# Patient Record
Sex: Male | Born: 1995 | Race: White | Hispanic: No | Marital: Single | Smoking: Former smoker
Health system: Southern US, Community
[De-identification: ages and names within clinical notes are randomized; demographics above are authoritative.]

---
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
## 2016-06-01 ENCOUNTER — Ambulatory Visit: Payer: Self-pay | Admitting: Family Medicine

## 2017-06-09 ENCOUNTER — Encounter: Payer: Self-pay | Admitting: Family Medicine

## 2017-06-09 ENCOUNTER — Ambulatory Visit (INDEPENDENT_AMBULATORY_CARE_PROVIDER_SITE_OTHER): Payer: Self-pay | Admitting: Family Medicine

## 2017-06-09 VITALS — BP 110/80 | HR 109 | Temp 99.1°F | Resp 17 | Wt 153.4 lb

## 2017-06-09 DIAGNOSIS — R05 Cough: Secondary | ICD-10-CM

## 2017-06-09 DIAGNOSIS — R509 Fever, unspecified: Secondary | ICD-10-CM

## 2017-06-09 DIAGNOSIS — R059 Cough, unspecified: Secondary | ICD-10-CM

## 2017-06-09 LAB — POC INFLUENZA A&B (BINAX/QUICKVUE)
Influenza A, POC: NEGATIVE
Influenza B, POC: NEGATIVE

## 2017-06-09 NOTE — Patient Instructions (Signed)
Discussed use of Mucinex D for congestion and Delsym for cough. We will call you with the x-ray result.

## 2017-06-09 NOTE — Progress Notes (Signed)
Subjective:     Patient ID: Ernest MourningKenneth W Harting III, male   DOB: 06-16-1995, 22 y.o.   MRN: [id number] Chief Complaint  Patient presents with  . Cough    Patient comes in office today accompanied by his mother with concerns of cough and body chills for less than 24hrs. Patient reports last night he was very hot and had body chills when symptoms began, patient only reports cough associated with chills. Patient denies sore throat, sinus pain/pressure, muscle ache, ear pain, runny nose or congestion. Patient has tried otc Tylenol Cold & Flu.   HPI Reports dry cough for a week and not feeling ill until the last 24 hours. No tobacco use or flu shot this season. Accompanied by his mother.  Review of Systems     Objective:   Physical Exam  Constitutional: He appears well-developed and well-nourished. No distress.  Ears: T.M's intact without inflammation Throat: no tonsillar enlargement or exudate Neck: no cervical adenopathy Lungs: clear     Assessment:    1. Cough with fever: ? Viral syndrome ? pneumonia  - POC Influenza A&B(BINAX/QUICKVUE) - DG Chest 2 View; Future    Plan:    Discussed otc medication and further f/u if not improving. No insurance so may not get the x-ray.

## 2017-06-11 ENCOUNTER — Emergency Department: Payer: Self-pay

## 2017-06-11 ENCOUNTER — Emergency Department
Admission: EM | Admit: 2017-06-11 | Discharge: 2017-06-11 | Disposition: A | Discharge status: Home / Self Care | Payer: Self-pay | Attending: Emergency Medicine | Admitting: Emergency Medicine

## 2017-06-11 ENCOUNTER — Other Ambulatory Visit: Payer: Self-pay

## 2017-06-11 DIAGNOSIS — N200 Calculus of kidney: Secondary | ICD-10-CM | POA: Insufficient documentation

## 2017-06-11 DIAGNOSIS — J189 Pneumonia, unspecified organism: Secondary | ICD-10-CM

## 2017-06-11 DIAGNOSIS — J101 Influenza due to other identified influenza virus with other respiratory manifestations: Secondary | ICD-10-CM

## 2017-06-11 DIAGNOSIS — J181 Lobar pneumonia, unspecified organism: Secondary | ICD-10-CM

## 2017-06-11 DIAGNOSIS — J1100 Influenza due to unidentified influenza virus with unspecified type of pneumonia: Secondary | ICD-10-CM | POA: Insufficient documentation

## 2017-06-11 DIAGNOSIS — R112 Nausea with vomiting, unspecified: Secondary | ICD-10-CM | POA: Insufficient documentation

## 2017-06-11 LAB — COMPREHENSIVE METABOLIC PANEL
ALT: 21 U/L (ref 17–63)
AST: 28 U/L (ref 15–41)
Albumin: 4.4 g/dL (ref 3.5–5.0)
Alkaline Phosphatase: 44 U/L (ref 38–126)
Anion gap: 14 (ref 5–15)
BUN: 7 mg/dL (ref 6–20)
CO2: 18 mmol/L — ABNORMAL LOW (ref 22–32)
Calcium: 9.2 mg/dL (ref 8.9–10.3)
Chloride: 96 mmol/L — ABNORMAL LOW (ref 101–111)
Creatinine, Ser: 0.76 mg/dL (ref 0.61–1.24)
GFR calc Af Amer: >60 mL/min (ref >60)
GFR calc non Af Amer: >60 mL/min (ref >60)
Glucose, Bld: 129 mg/dL — ABNORMAL HIGH (ref 65–99)
Potassium: 3.2 mmol/L — ABNORMAL LOW (ref 3.5–5.1)
Sodium: 128 mmol/L — ABNORMAL LOW (ref 135–145)
Total Bilirubin: 1.1 mg/dL (ref 0.3–1.2)
Total Protein: 8.2 g/dL — ABNORMAL HIGH (ref 6.5–8.1)

## 2017-06-11 LAB — URINALYSIS, COMPLETE (UACMP) WITH MICROSCOPIC
Bacteria, UA: NONE SEEN
Glucose, UA: NEGATIVE mg/dL
Ketones, ur: 5 mg/dL — AB
Leukocytes, UA: NEGATIVE
Nitrite: NEGATIVE
Protein, ur: 100 mg/dL — AB
Specific Gravity, Urine: 1.035 — ABNORMAL HIGH (ref 1.005–1.030)
pH: 6 (ref 5.0–8.0)

## 2017-06-11 LAB — CBC
HCT: 43.3 % (ref 40.0–52.0)
Hemoglobin: 15.3 g/dL (ref 13.0–18.0)
MCH: 31.6 pg (ref 26.0–34.0)
MCHC: 35.2 g/dL (ref 32.0–36.0)
MCV: 89.6 fL (ref 80.0–100.0)
Platelets: 200 10*3/uL (ref 150–440)
RBC: 4.84 MIL/uL (ref 4.40–5.90)
RDW: 12.9 % (ref 11.5–14.5)
WBC: 13.1 10*3/uL — ABNORMAL HIGH (ref 3.8–10.6)

## 2017-06-11 LAB — INFLUENZA PANEL BY PCR (TYPE A & B)
Influenza A By PCR: POSITIVE — AB
Influenza B By PCR: NEGATIVE

## 2017-06-11 LAB — LIPASE, BLOOD: Lipase: 23 U/L (ref 11–51)

## 2017-06-11 MED ORDER — LEVOFLOXACIN IN D5W 750 MG/150ML IV SOLN
750.0000 mg | Freq: Once | INTRAVENOUS | Status: AC
  Administered 2017-06-11: 750 mg via INTRAVENOUS

## 2017-06-11 MED ORDER — ONDANSETRON HCL 4 MG/2ML IJ SOLN
4.0000 mg | Freq: Once | INTRAMUSCULAR | Status: AC
  Administered 2017-06-11: 4 mg via INTRAVENOUS
  Filled 2017-06-11: qty 2

## 2017-06-11 MED ORDER — SODIUM CHLORIDE 0.9 % IV BOLUS (SEPSIS)
1000.0000 mL | Freq: Once | INTRAVENOUS | Status: AC
  Administered 2017-06-11: 1000 mL via INTRAVENOUS

## 2017-06-11 MED ORDER — KETOROLAC TROMETHAMINE 30 MG/ML IJ SOLN
30.0000 mg | Freq: Once | INTRAMUSCULAR | Status: AC
  Administered 2017-06-11: 30 mg via INTRAVENOUS
  Filled 2017-06-11: qty 1

## 2017-06-11 MED ORDER — ONDANSETRON HCL 4 MG/2ML IJ SOLN
INTRAMUSCULAR | Status: AC
  Filled 2017-06-11: qty 2

## 2017-06-11 MED ORDER — OSELTAMIVIR PHOSPHATE 75 MG PO CAPS: 75.0000 mg | ORAL_CAPSULE | Freq: Two times a day (BID) | ORAL | 0 refills | Status: AC

## 2017-06-11 MED ORDER — LEVOFLOXACIN 750 MG PO TABS: 750.0000 mg | ORAL_TABLET | Freq: Every day | ORAL | 0 refills | Status: AC

## 2017-06-11 MED ORDER — LEVOFLOXACIN IN D5W 750 MG/150ML IV SOLN
INTRAVENOUS | Status: AC
  Administered 2017-06-11: 750 mg via INTRAVENOUS
  Filled 2017-06-11: qty 150

## 2017-06-11 MED ORDER — ONDANSETRON HCL 4 MG/2ML IJ SOLN
4.0000 mg | Freq: Once | INTRAMUSCULAR | Status: AC
  Administered 2017-06-11: 4 mg via INTRAVENOUS

## 2017-06-11 MED ORDER — ONDANSETRON 4 MG PO TBDP
4.0000 mg | ORAL_TABLET | Freq: Once | ORAL | Status: DC | PRN
  Filled 2017-06-11: qty 1

## 2017-06-11 MED ORDER — OSELTAMIVIR PHOSPHATE 75 MG PO CAPS
75.0000 mg | ORAL_CAPSULE | Freq: Once | ORAL | Status: AC
  Administered 2017-06-11: 75 mg via ORAL

## 2017-06-11 MED ORDER — ONDANSETRON 4 MG PO TBDP: 4.0000 mg | ORAL_TABLET | Freq: Three times a day (TID) | ORAL | 0 refills | Status: DC | PRN

## 2017-06-11 MED ORDER — OSELTAMIVIR PHOSPHATE 75 MG PO CAPS
ORAL_CAPSULE | ORAL | Status: AC
  Administered 2017-06-11: 75 mg via ORAL
  Filled 2017-06-11: qty 1

## 2017-06-11 MED ORDER — ACETAMINOPHEN 500 MG PO TABS
ORAL_TABLET | ORAL | Status: AC
  Administered 2017-06-11: 1000 mg via ORAL
  Filled 2017-06-11: qty 2

## 2017-06-11 MED ORDER — ACETAMINOPHEN 500 MG PO TABS
1000.0000 mg | ORAL_TABLET | Freq: Once | ORAL | Status: AC
  Administered 2017-06-11: 1000 mg via ORAL

## 2017-06-11 NOTE — ED Provider Notes (Addendum)
[facility] Emergency Department Provider Note  Time seen: 7:48 AM  I have reviewed the triage vital signs and the nursing notes.   HISTORY  Chief Complaint Nausea; Emesis; and Generalized Body Aches    HPI Ernest Ramirez is a 22 y.o. male with no past medical history who presents to the emergency department with nausea vomiting body aches cough.  According to the patient for the past 2 days he has been extremely nauseated, vomiting after eating or drinking anything.  States he has had a mild cough, went to his doctor Friday was tested for the flu but it was negative.  Patient states subjective fever but they have not measured a temperature.  States his father is at home with similar symptoms.  Patient also states for the past 1 day he has been experiencing lower back pain right greater than left.  Describes the back pain is mild to moderate.  Denies any anterior abdominal discomfort.  States significant nausea with frequent vomiting.  Denies any diarrhea.  Last normal bowel movement several days ago.   No past medical history on file.  There are no active problems to display for this patient.   No past surgical history on file.  Prior to Admission medications   Not on File    Allergies  Allergen Reactions  . Penicillins Hives    No family history on file.  Social History Social History   Tobacco Use  . Smoking status: Current Some Day Smoker  . Smokeless tobacco: Never Used  Substance Use Topics  . Alcohol use: Not on file  . Drug use: Not on file    Review of Systems Constitutional: Subjective fever, have measured temperatures but have not measured a fever. Eyes: Negative for visual complaints ENT: Negative for recent illness/congestion Cardiovascular: Negative for chest pain. Respiratory: Negative for shortness of breath.  Occasional cough. Gastrointestinal: Negative for abdominal pain.  Frequent vomiting.  Negative for  diarrhea. Genitourinary: Negative for urinary compaints Musculoskeletal: Mild to moderate lower back pain right greater than left. Skin: Negative for skin complaints  Neurological: Negative for headache All other ROS negative  ____________________________________________   PHYSICAL EXAM:  VITAL SIGNS: ED Triage Vitals  Enc Vitals Group     BP 06/11/17 0636 121/73     Pulse Rate 06/11/17 0636 (!) 110     Resp 06/11/17 0636 18     Temp 06/11/17 0636 97.9 F (36.6 C)     Temp Source 06/11/17 0636 Oral     SpO2 06/11/17 0636 100 %     Weight 06/11/17 0636 153 lb (69.4 kg)     Height 06/11/17 0636 6' (1.829 m)     Head Circumference --      Peak Flow --      Pain Score 06/11/17 0648 8     Pain Loc --      Pain Edu? --      Excl. in GC? --     Constitutional: Alert and oriented.  Nontoxic in appearance, no distress. Eyes: Normal exam ENT   Head: Normocephalic and atraumatic.   Mouth/Throat: Dry lips and mucous membranes. Cardiovascular: Normal rate, regular rhythm around 100 bpm.  No murmur. Respiratory: Normal respiratory effort without tachypnea nor retractions. Breath sounds are clear  Gastrointestinal: Soft and nontender. No distention.  There is no CVA tenderness. Musculoskeletal: Nontender with normal range of motion in all extremities. No lower extremity tenderness or edema.  No back tenderness palpated Neurologic:  Normal speech and language. No gross focal neurologic deficits  Skin:  Skin is warm, dry and intact.  Psychiatric: Mood and affect are normal.   ____________________________________________    RADIOLOGY  Chest x-ray shows left lower lobe pneumonia. CT scan shows renal stone but no ureterolithiasis.  Left lower lobe pneumonia.  ____________________________________________   INITIAL IMPRESSION / ASSESSMENT AND PLAN / ED COURSE  Pertinent labs & imaging results that were available during my care of the patient were reviewed by me and  considered in my medical decision making (see chart for details).  Patient presents to the emergency department for nausea and vomiting body aches occasional cough and back pain.  The patient differential would include viral illness such as influenza, pneumonia, gastroenteritis, ureterolithiasis, urinary tract infection.  Patient's labs have resulted showing hyponatremia with an anion gap of 14 indicating moderate dehydration.  We will continue with IV hydration.  White blood cell count of 13,000.  Urinalysis shows too numerous to count red blood cells with minimal white cells and no bacteria.  We will send a urine culture.  Patient has a history of one prior kidney stone approximately 1-2 years ago per patient.  Given the patient's significant symptoms we will obtain a CT scan of the abdomen/pelvis renal protocol to rule out kidney stone.  I discussed this with the patient and mother who are agreeable.  Given the occasional cough with significant vomiting we will obtain a chest x-ray to further evaluate.  We will continue with IV hydration likely 2-3 L in total and continue to closely monitor.  Patient states the nausea is much improved at this time.  Labs are positive for influenza A.  X-ray positive for left lower lobe pneumonia.  Given the influenza positive screen and pneumonia on chest x-ray we will place the patient on Tamiflu, Levaquin.  I also discussed supportive care such as Tylenol or ibuprofen as needed for fever/discomfort and Zofran for nausea.  Overall the patient appears well, much improved at this time.  Discussed the plan of care with patient and mom who are agreeable.  I also discussed return precautions.    ____________________________________________   FINAL CLINICAL IMPRESSION(S) / ED DIAGNOSES  Nausea vomiting Body aches Influenza Pneumonia    Minna AntisPaduchowski, Macallan Ord, MD 06/11/17 1023    Minna AntisPaduchowski, Xana Bradt, MD 06/11/17 1032

## 2017-06-11 NOTE — ED Notes (Signed)
Pt ambulatory to BR to collect urine specimen; pt noted to be vomiting while in the BR

## 2017-06-11 NOTE — ED Triage Notes (Addendum)
Pt reports N/V with general body aches and chills; pain to lower right back; pt vomits within a short period of time after eating or drinking; pt says he's been feeling bad since Thursday. [facility], influenza negative; pt was not vomiting at that point; mom says pt has been "on fire" but no temp ever taken; pt denies abd pain and diarrhea; denies urinary s/s; pt says his father is at home with the same symptoms

## 2017-09-28 ENCOUNTER — Ambulatory Visit: Payer: Self-pay | Admitting: Family Medicine

## 2017-09-28 ENCOUNTER — Encounter: Payer: Self-pay | Admitting: Family Medicine

## 2017-09-28 VITALS — BP 118/72 | Temp 98.2°F | Wt 145.8 lb

## 2017-09-28 DIAGNOSIS — J329 Chronic sinusitis, unspecified: Secondary | ICD-10-CM

## 2017-09-28 MED ORDER — CLARITHROMYCIN 500 MG PO TABS: 500.0000 mg | ORAL_TABLET | Freq: Two times a day (BID) | ORAL | 0 refills | Status: DC

## 2017-09-28 NOTE — Progress Notes (Signed)
   Subjective:    Patient ID: Ernest Ramirez, male    DOB: March 31, 1996, 22 y.o.   MRN: [id number]  Cough  This is a new problem. The current episode started in the past 7 days. Associated symptoms include ear pain, nasal congestion and a sore throat. Associated symptoms comments: Sinus pressure.   Living in [place], working on [employer]    some smoking  Hit hard  Coughing and got worse  Some cong and throat  No fever    Dim energy bad congeste d sor e throat   pos throat pain an dtrouble speaking  No one else sick       Review of Systems  HENT: Positive for ear pain and sore throat.   Respiratory: Positive for cough.        Objective:   Physical Exam  Alert, mild malaise. Hydration good Vitals stable. frontal/ maxillary tenderness evident positive nasal congestion. pharynx normal neck supple  lungs clear/no crackles or wheezes. heart regular in rhythm       Assessment & Plan:  Impression rhinosinusitis likely post viral, discussed with patient. plan antibiotics prescribed. Questions answered. Symptomatic care discussed. warning signs discussed. WSL

## 2018-06-07 ENCOUNTER — Ambulatory Visit: Payer: Self-pay | Admitting: Family Medicine

## 2018-06-07 ENCOUNTER — Encounter: Payer: Self-pay | Admitting: Family Medicine

## 2018-06-07 VITALS — BP 124/86 | Temp 98.7°F | Ht 72.0 in | Wt 146.0 lb

## 2018-06-07 DIAGNOSIS — B9789 Other viral agents as the cause of diseases classified elsewhere: Secondary | ICD-10-CM

## 2018-06-07 DIAGNOSIS — J069 Acute upper respiratory infection, unspecified: Secondary | ICD-10-CM

## 2018-06-07 NOTE — Progress Notes (Signed)
   Subjective:    Patient ID: Ernest Ramirez, male    DOB: 1995/11/19, 23 y.o.   MRN: [id number]  Sinusitis  This is a new problem. Episode onset: 3 days. Associated symptoms include chills, congestion, coughing and headaches. Pertinent negatives include no ear pain or sore throat. (Wheezing) Past treatments include acetaminophen and oral decongestants.   Started Monday night with cough and rhinorrhea. Then progressed last night got worse, h/a feels like band around head and ache in eyes. No known fever. Reports chills. No bodyaches. Cough productive at times of some mucous. Reports a little bit of shortness of breath and wheezing at times.   Took mucinex yesterday, tylenol last night and this morning: feels like this was helpful.   No known sick contacts.   Review of Systems  Constitutional: Positive for chills. Negative for fever.  HENT: Positive for congestion and rhinorrhea. Negative for ear pain and sore throat.   Respiratory: Positive for cough.   Gastrointestinal: Negative for diarrhea, nausea and vomiting.  Neurological: Positive for headaches.       Objective:   Physical Exam Vitals signs and nursing note reviewed.  Constitutional:      General: He is not in acute distress.    Appearance: Normal appearance. He is not toxic-appearing.  HENT:     Head: Normocephalic and atraumatic.     Right Ear: Tympanic membrane normal.     Left Ear: Tympanic membrane normal.     Nose: Congestion present.     Right Sinus: No maxillary sinus tenderness or frontal sinus tenderness.     Left Sinus: No maxillary sinus tenderness or frontal sinus tenderness.     Mouth/Throat:     Mouth: Mucous membranes are moist.     Pharynx: Oropharynx is clear.  Eyes:     General:        Right eye: No discharge.        Left eye: No discharge.  Neck:     Musculoskeletal: Neck supple. No neck rigidity.  Cardiovascular:     Rate and Rhythm: Normal rate and regular rhythm.     Heart sounds: Normal  heart sounds.  Pulmonary:     Effort: Pulmonary effort is normal. No respiratory distress.     Breath sounds: Normal breath sounds. No wheezing or rales.  Lymphadenopathy:     Cervical: No cervical adenopathy.  Skin:    General: Skin is warm and dry.  Neurological:     Mental Status: He is alert and oriented to person, place, and time.           Assessment & Plan:  Viral URI with cough  Discussed likely viral etiology at this time, antibiotics not warranted. Symptomatic care discussed, warning signs discussed. F/u if symptoms worsen or fail to improve.

## 2018-06-07 NOTE — Patient Instructions (Signed)
Viral Respiratory Infection  A viral respiratory infection is an illness that affects parts of the body that are used for breathing. These include the lungs, nose, and throat. It is caused by a germ called a virus.  Some examples of this kind of infection are:  · A cold.  · The flu (influenza).  · A respiratory syncytial virus (RSV) infection.  A person who gets this illness may have the following symptoms:  · A stuffy or runny nose.  · Yellow or green fluid in the nose.  · A cough.  · Sneezing.  · Tiredness (fatigue).  · Achy muscles.  · A sore throat.  · Sweating or chills.  · A fever.  · A headache.  Follow these instructions at home:  Managing pain and congestion  · Take over-the-counter and prescription medicines only as told by your doctor.  · If you have a sore throat, gargle with salt water. Do this 3-4 times per day or as needed. To make a salt-water mixture, dissolve ½-1 tsp of salt in 1 cup of warm water. Make sure that all the salt dissolves.  · Use nose drops made from salt water. This helps with stuffiness (congestion). It also helps soften the skin around your nose.  · Drink enough fluid to keep your pee (urine) pale yellow.  General instructions    · Rest as much as possible.  · Do not drink alcohol.  · Do not use any products that have nicotine or tobacco, such as cigarettes and e-cigarettes. If you need help quitting, ask your doctor.  · Keep all follow-up visits as told by your doctor. This is important.  How is this prevented?    · Get a flu shot every year. Ask your doctor when you should get your flu shot.  · Do not let other people get your germs. If you are sick:  ? Stay home from work or school.  ? Wash your hands with soap and water often. Wash your hands after you cough or sneeze. If soap and water are not available, use hand sanitizer.  · Avoid contact with people who are sick during cold and flu season. This is in fall and winter.  Get help if:  · Your symptoms last for 10 days or  longer.  · Your symptoms get worse over time.  · You have a fever.  · You have very bad pain in your face or forehead.  · Parts of your jaw or neck become very swollen.  Get help right away if:  · You feel pain or pressure in your chest.  · You have shortness of breath.  · You faint or feel like you will faint.  · You keep throwing up (vomiting).  · You feel confused.  Summary  · A viral respiratory infection is an illness that affects parts of the body that are used for breathing.  · Examples of this illness include a cold, the flu, and respiratory syncytial virus (RSV) infection.  · The infection can cause a runny nose, cough, sneezing, sore throat, and fever.  · Follow what your doctor tells you about taking medicines, drinking lots of fluid, washing your hands, resting at home, and avoiding people who are sick.  This information is not intended to replace advice given to you by your health care provider. Make sure you discuss any questions you have with your health care provider.  Document Released: 04/07/2008 Document Revised: 06/05/2017 Document Reviewed: 06/05/2017  Elsevier   Interactive Patient Education © 2019 [employer]vier Inc.

## 2018-07-11 ENCOUNTER — Encounter: Payer: Self-pay | Admitting: Family Medicine

## 2018-07-11 ENCOUNTER — Ambulatory Visit: Payer: Self-pay | Admitting: Family Medicine

## 2018-07-11 VITALS — BP 124/82 | Temp 98.7°F | Ht 72.0 in | Wt 139.0 lb

## 2018-07-11 DIAGNOSIS — B078 Other viral warts: Secondary | ICD-10-CM

## 2018-07-11 MED ORDER — HYDROCORTISONE 2.5 % EX CREA: TOPICAL_CREAM | Freq: Two times a day (BID) | CUTANEOUS | 1 refills | Status: DC

## 2018-07-11 NOTE — Progress Notes (Signed)
   Subjective:    Patient ID: Ernest Ramirez, male    DOB: [birth year], 23 y.o.   MRN: [id number]  Rash  This is a new problem. Location: arms, abdomen. He was exposed to nothing. Past treatments include nothing.   Would like to get HPV vaccine. Pt has had a lot of warts removed by Dr. Margo Aye and mother states DR. Margo Aye told her vaccine could help.   Working   Overall pretty good     Review of Systems  Skin: Positive for rash.       Objective:   Physical Exam  Alert vitals stable, NAD. Blood pressure good on repeat. HEENT normal. Lungs clear. Heart regular rate and rhythm. Arms reveal multiple discrete cutaneous warts      Assessment & Plan:  Impression multiple recalcitrant warts.  Discussed.  NIH order to review performed in presence of patient.  Indeed it appears that the Gardasil injections confer some benefit.  Discussed.  Patient to get at the health department because of no insurance

## 2018-07-12 IMAGING — CR DG CHEST 2V
2 series · 2 of 2 positions shown · non-contrast
Comparison: Concurrent CT abdomen/pelvis dated 06/11/2017.

CLINICAL DATA: Cough, nonsmoker

EXAM:
CHEST  2 VIEW

[chest pa]
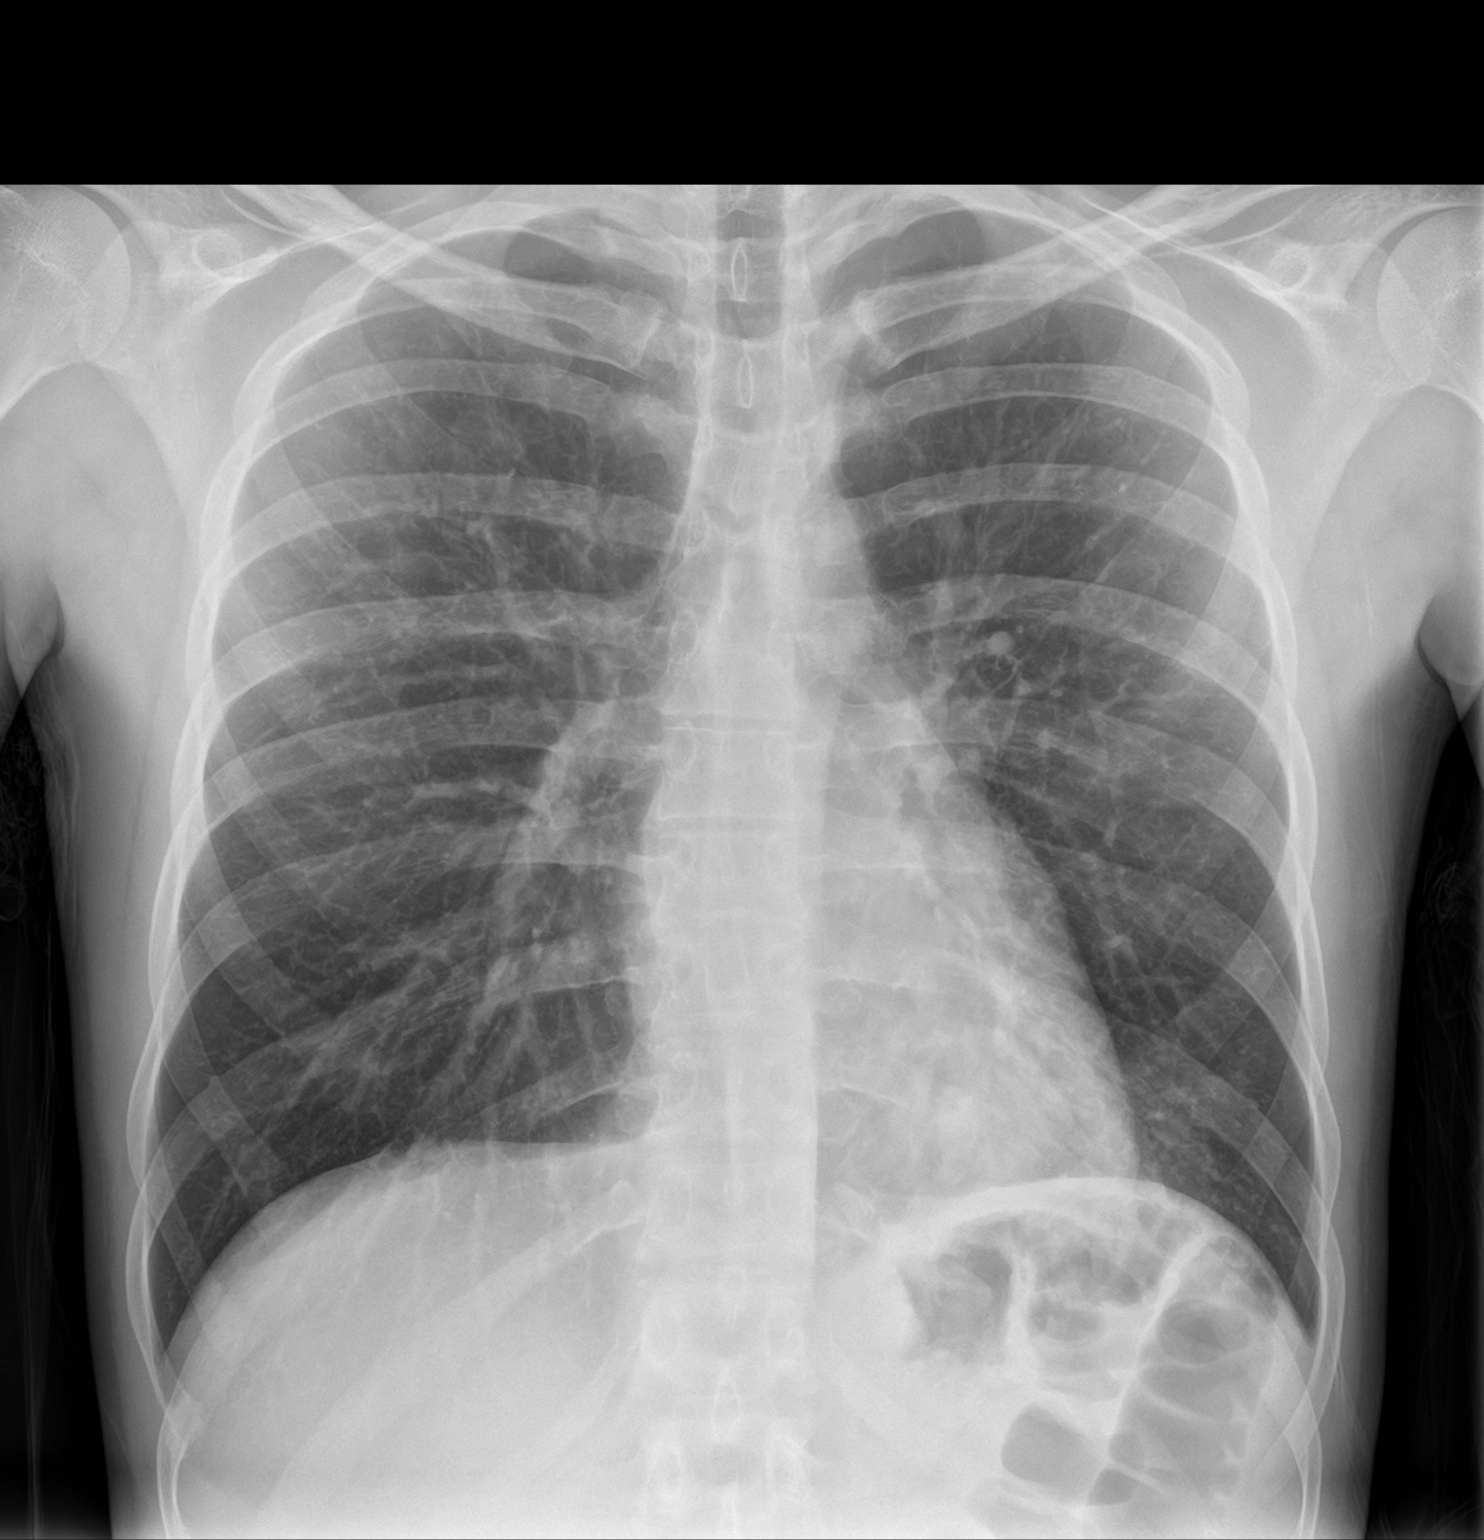

[chest lat]
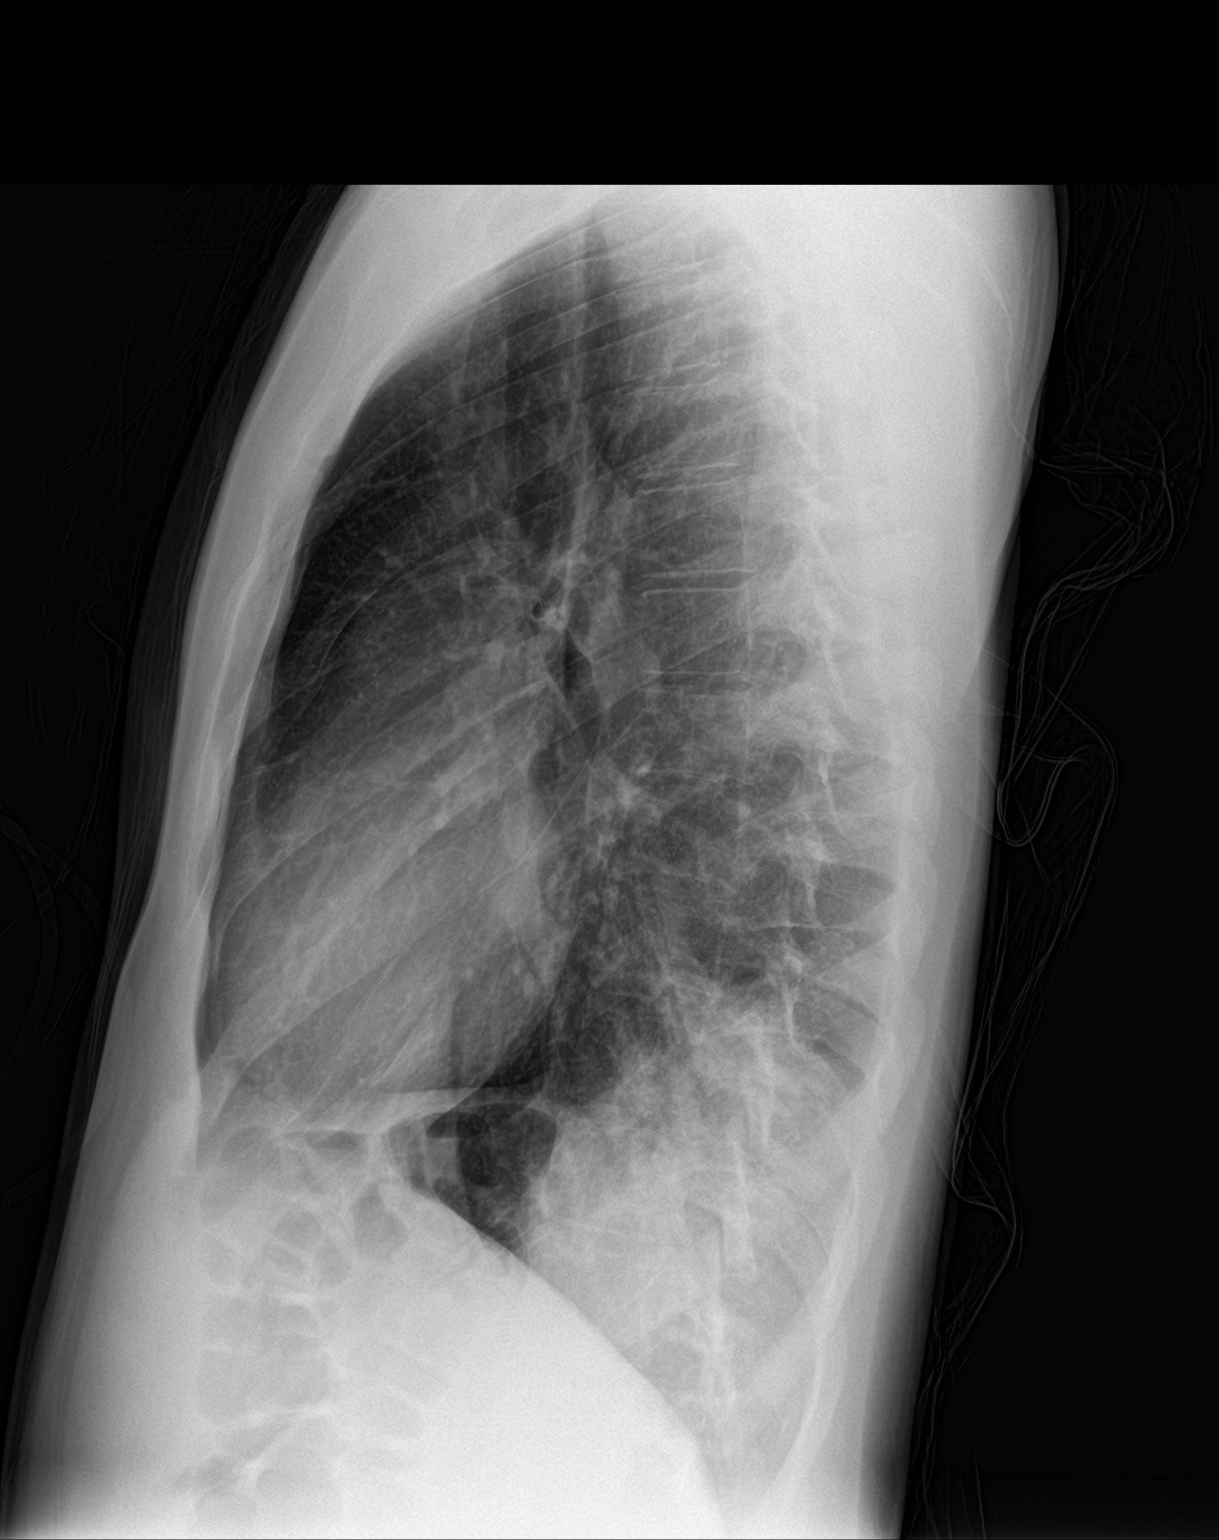

[2 of 2 positions shown; findings below may reference images not displayed]

FINDINGS: Retrocardiac opacity, best visualized on the lateral view,
compatible with pneumonia.

Right lung is clear.  No pleural effusion or pneumothorax.

The heart is normal in size.

Visualized osseous structures are within normal limits.
IMPRESSION: Left lower lobe pneumonia.

## 2019-06-05 ENCOUNTER — Encounter: Payer: Self-pay | Admitting: Family Medicine

## 2019-06-06 ENCOUNTER — Encounter: Payer: Self-pay | Admitting: Family Medicine

## 2019-07-04 ENCOUNTER — Telehealth: Payer: Self-pay | Admitting: Family Medicine

## 2019-07-04 NOTE — Telephone Encounter (Signed)
I'm not opposed to her being with son , let her know that is against current covid recommendations but if she absolutely has issues she needs to address she can come in, the hpv is free at the h dept for uninsured patients and extremely costly here, her choice

## 2019-07-04 NOTE — Telephone Encounter (Signed)
Mom called and scheduled pts physical. She would like to come to physical. I tried to explain to her right now it is only the pt able to come in room but she wants to discuss things with Dr. Brett Canales about pt and wants to be here since she is paying for it.

## 2019-07-04 NOTE — Telephone Encounter (Signed)
Mom called back and also asked about the HPV shot.  She said that she really wants him to get the shot because he has warts all over his body.  She said last time they were told to go to the health department since he is self pay but said she wants to get it here.  She wants to make sure when he gets his physical that he also gets the shot.

## 2019-07-04 NOTE — Telephone Encounter (Signed)
Moms voicemail is full. (DPR not on file for mom; was going to let mom know that we do not have access to talk with her so she will need to fill out DPR)

## 2019-07-08 ENCOUNTER — Encounter: Payer: Self-pay | Admitting: Family Medicine

## 2019-07-12 ENCOUNTER — Encounter: Payer: Self-pay | Admitting: Family Medicine

## 2019-07-15 ENCOUNTER — Encounter: Payer: Self-pay | Admitting: Family Medicine

## 2019-07-19 NOTE — Telephone Encounter (Signed)
Has appt on the 16th

## 2019-07-23 ENCOUNTER — Other Ambulatory Visit: Payer: Self-pay

## 2019-07-23 ENCOUNTER — Ambulatory Visit (INDEPENDENT_AMBULATORY_CARE_PROVIDER_SITE_OTHER): Payer: Self-pay | Admitting: Family Medicine

## 2019-07-23 VITALS — BP 118/78 | Temp 97.8°F | Ht 69.0 in | Wt 148.0 lb

## 2019-07-23 DIAGNOSIS — Z0000 Encounter for general adult medical examination without abnormal findings: Secondary | ICD-10-CM

## 2019-07-23 DIAGNOSIS — Z23 Encounter for immunization: Secondary | ICD-10-CM

## 2019-07-23 NOTE — Progress Notes (Signed)
   Subjective:    Patient ID: Ernest Ramirez, male    DOB: April 18, 1996, 24 y.o.   MRN: [id number]  HPI  The patient comes in today for a wellness visit.    A review of their health history was completed.  A review of medications was also completed.  Any needed refills  Eating habits: alright  Falls/  MVA accidents in past few months: none  Regular exercise: 3-4 times a week  Specialist pt sees on regular basis: none  Preventative health issues were discussed.   Additional concerns: Patient would like to discuss HPV vaccine and start series  Gets a lot of exercise with the job  Non smoking  No acohol intake     Eats overall good Review of Systems No headache, no major weight loss or weight gain, no chest pain no back pain abdominal pain no change in bowel habits complete ROS otherwise negative     Objective:   Physical Exam Vitals reviewed.  Constitutional:      Appearance: He is well-developed.  HENT:     Head: Normocephalic and atraumatic.     Right Ear: External ear normal.     Left Ear: External ear normal.     Nose: Nose normal.  Eyes:     Pupils: Pupils are equal, round, and reactive to light.  Neck:     Thyroid: No thyromegaly.  Cardiovascular:     Rate and Rhythm: Normal rate and regular rhythm.     Heart sounds: Normal heart sounds. No murmur.  Pulmonary:     Effort: Pulmonary effort is normal. No respiratory distress.     Breath sounds: Normal breath sounds. No wheezing.  Abdominal:     General: Bowel sounds are normal. There is no distension.     Palpations: Abdomen is soft. There is no mass.     Tenderness: There is no abdominal tenderness.  Genitourinary:    Penis: Normal.   Musculoskeletal:        General: Normal range of motion.     Cervical back: Normal range of motion and neck supple.  Lymphadenopathy:     Cervical: No cervical adenopathy.  Skin:    General: Skin is warm and dry.     Findings: No erythema.  Neurological:    Mental Status: He is alert.     Motor: No abnormal muscle tone.  Psychiatric:        Behavior: Behavior normal.        Judgment: Judgment normal.           Assessment & Plan:  Impression wellness exam.  Diet discussed.  Exercise discussed.  Working full-time and likes his job.  Has struggled with recurrent and warts.  His dermatologist recommended HPV vaccine.  Patient would prefer to get it here and pay for it rather than get it at the health department.  HPV #1 today.

## 2019-09-24 ENCOUNTER — Other Ambulatory Visit (INDEPENDENT_AMBULATORY_CARE_PROVIDER_SITE_OTHER): Payer: Self-pay

## 2019-09-24 ENCOUNTER — Other Ambulatory Visit: Payer: Self-pay

## 2019-09-24 DIAGNOSIS — Z23 Encounter for immunization: Secondary | ICD-10-CM

## 2019-12-03 ENCOUNTER — Encounter: Payer: Self-pay | Admitting: Emergency Medicine

## 2019-12-03 ENCOUNTER — Other Ambulatory Visit: Payer: Self-pay

## 2019-12-03 ENCOUNTER — Emergency Department
Admission: EM | Admit: 2019-12-03 | Discharge: 2019-12-03 | Disposition: A | Discharge status: Home / Self Care | Payer: Self-pay | Attending: Emergency Medicine | Admitting: Emergency Medicine

## 2019-12-03 DIAGNOSIS — J029 Acute pharyngitis, unspecified: Secondary | ICD-10-CM | POA: Insufficient documentation

## 2019-12-03 DIAGNOSIS — Z87891 Personal history of nicotine dependence: Secondary | ICD-10-CM | POA: Insufficient documentation

## 2019-12-03 DIAGNOSIS — K120 Recurrent oral aphthae: Secondary | ICD-10-CM | POA: Insufficient documentation

## 2019-12-03 DIAGNOSIS — Z20822 Contact with and (suspected) exposure to covid-19: Secondary | ICD-10-CM | POA: Insufficient documentation

## 2019-12-03 LAB — SARS CORONAVIRUS 2 BY RT PCR (HOSPITAL ORDER, PERFORMED IN ~~LOC~~ HOSPITAL LAB): SARS Coronavirus 2: NEGATIVE

## 2019-12-03 LAB — GROUP A STREP BY PCR: Group A Strep by PCR: NOT DETECTED

## 2019-12-03 MED ORDER — PREDNISONE 20 MG PO TABS: 20.0000 mg | ORAL_TABLET | Freq: Two times a day (BID) | ORAL | 0 refills | Status: AC

## 2019-12-03 MED ORDER — PREDNISONE 20 MG PO TABS
60.0000 mg | ORAL_TABLET | Freq: Once | ORAL | Status: AC
  Administered 2019-12-03: 60 mg via ORAL
  Filled 2019-12-03: qty 3

## 2019-12-03 MED ORDER — LIDOCAINE VISCOUS HCL 2 % MT SOLN
15.0000 mL | Freq: Once | OROMUCOSAL | Status: AC
  Administered 2019-12-03: 15 mL via OROMUCOSAL
  Filled 2019-12-03: qty 15

## 2019-12-03 MED ORDER — MAGIC MOUTHWASH W/LIDOCAINE: 10.0000 mL | Freq: Four times a day (QID) | ORAL | 0 refills | Status: AC | PRN

## 2019-12-03 NOTE — Discharge Instructions (Addendum)
Your tests are negative for strep for Covid at this time.  You are being treated for pharyngitis due to canker sores.  Take the steroid as directed and use the Magic mouthwash as needed. Follow-up with your provider for continued symptoms.

## 2019-12-03 NOTE — ED Provider Notes (Signed)
[facility] Emergency Department Provider Note ____________________________________________  Time seen: 1030  I have reviewed the triage vital signs and the nursing notes.  HISTORY  Chief Complaint  Sore Throat, Cough, and Chills  HPI Ernest Ramirez is a 24 y.o. male presents himself to the ED for evaluation of resolved symptoms including fevers, chills, cough, congestion, and nausea.  He is now left with sore throat only.  He dose Tylenol intermittently for symptoms.  He denies any sick contacts, recent travel, or other exposures.  He has not previously been vaccinated against Covid.  History reviewed. No pertinent past medical history.  There are no problems to display for this patient.  History reviewed. No pertinent surgical history.  Prior to Admission medications   Medication Sig Start Date End Date Taking? Authorizing Provider  magic mouthwash w/lidocaine SOLN Take 10 mLs by mouth 4 (four) times daily as needed for mouth pain. 12/03/19   Sylwia Cuervo, Charlesetta Ivory, PA-C  predniSONE (DELTASONE) 20 MG tablet Take 1 tablet (20 mg total) by mouth 2 (two) times daily with a meal for 5 days. 12/03/19 12/08/19  Jowanna Loeffler, Charlesetta Ivory, PA-C    Allergies Penicillins  History reviewed. No pertinent family history.  Social History Social History   Tobacco Use  . Smoking status: Former Games developer  . Smokeless tobacco: Never Used  Substance Use Topics  . Alcohol use: Not on file  . Drug use: Not on file    Review of Systems  Constitutional: Positive for fever. Eyes: Negative for visual changes. ENT: Positive for sore throat. Cardiovascular: Negative for chest pain. Respiratory: Negative for shortness of breath. Gastrointestinal: Negative for abdominal pain, vomiting and diarrhea. Genitourinary: Negative for dysuria. Musculoskeletal: Negative for back pain. Skin: Negative for rash. Neurological: Negative for headaches, focal weakness or  numbness. ____________________________________________  PHYSICAL EXAM:  VITAL SIGNS: ED Triage Vitals  Enc Vitals Group     BP 12/03/19 1016 (!) 99/49     Pulse Rate 12/03/19 1016 77     Resp 12/03/19 1016 20     Temp 12/03/19 1016 99.1 F (37.3 C)     Temp Source 12/03/19 1016 Oral     SpO2 12/03/19 1016 98 %     Weight 12/03/19 1013 145 lb (65.8 kg)     Height 12/03/19 1013 6' (1.829 m)     Head Circumference --      Peak Flow --      Pain Score 12/03/19 1013 7     Pain Loc --      Pain Edu? --      Excl. in GC? --     Constitutional: Alert and oriented. Well appearing and in no distress. Head: Normocephalic and atraumatic. Eyes: Conjunctivae are normal. PERRL. Normal extraocular movements Ears: Canals clear. TMs intact bilaterally. Nose: No congestion/rhinorrhea/epistaxis. Mouth/Throat: Mucous membranes are moist.  Uvula is midline and tonsils are flat.  There is oropharyngeal erythema noted generally, the patient with 2 distinct aphthosis ulcers to the tonsillar pillars.  No brawny sublingual erythema is noted. Neck: Supple. No thyromegaly. Hematological/Lymphatic/Immunological: No cervical lymphadenopathy. Cardiovascular: Normal rate, regular rhythm. Normal distal pulses. Respiratory: Normal respiratory effort. No wheezes/rales/rhonchi. Gastrointestinal: Soft and nontender. No distention. Musculoskeletal: Nontender with normal range of motion in all extremities.  Neurologic:  Normal gait without ataxia. Normal speech and language. No gross focal neurologic deficits are appreciated. Skin:  Skin is warm, dry and intact. No rash noted. Psychiatric: Mood and affect are normal. Patient exhibits  appropriate insight and judgment. ____________________________________________   LABS (pertinent positives/negatives)  Labs Reviewed  SARS CORONAVIRUS 2 BY RT PCR (HOSPITAL ORDER, PERFORMED IN [facility])  GROUP A STREP BY PCR   ____________________________________________  PROCEDURES  Prednisone 60 mg PO Lidocaine 2% viscous gargle  Procedures ____________________________________________  INITIAL IMPRESSION / ASSESSMENT AND PLAN / ED COURSE  Patient with ED evaluation of symptoms including previous fevers, chills, nausea vomiting, presents for evaluation of ongoing sore throat.  Patient on exam was found to have multiple aphthous ulcers to the oropharyngeal region.  No tonsillar erythema, edema, or exudate was noted.  Patient was found to be negative for his SARS Covid screening and strep PCR.  He will be treated with prednisone and viscous lidocaine for his stomatitis.  He will follow-up with a primary provider return to the ED as needed.  Ernest Ramirez was evaluated in Emergency Department on 12/03/2019 for the symptoms described in the history of present illness. He was evaluated in the context of the global COVID-19 pandemic, which necessitated consideration that the patient might be at risk for infection with the SARS-CoV-2 virus that causes COVID-19. Institutional protocols and algorithms that pertain to the evaluation of patients at risk for COVID-19 are in a state of rapid change based on information released by regulatory bodies including the CDC and federal and state organizations. These policies and algorithms were followed during the patient's care in the ED. ____________________________________________  FINAL CLINICAL IMPRESSION(S) / ED DIAGNOSES  Final diagnoses:  Oral aphthae  Pharyngitis, unspecified etiology      Karmen Stabs, Charlesetta Ivory, PA-C 12/03/19 1457    Jene Every, MD 12/03/19 1505

## 2019-12-03 NOTE — ED Notes (Signed)
See triage note  Presents with sore throat and not feeling well for several days  Low grade temp on arrival   And conts to have sore throat

## 2019-12-03 NOTE — ED Triage Notes (Signed)
Pt reports cold sx's for the past week, chills, cough and sore throat. Pt reports that most sx's have gone but his throat still hurts. Denies fevers

## 2020-03-18 ENCOUNTER — Other Ambulatory Visit (INDEPENDENT_AMBULATORY_CARE_PROVIDER_SITE_OTHER): Payer: Self-pay | Admitting: *Deleted

## 2020-03-18 ENCOUNTER — Other Ambulatory Visit: Payer: Self-pay

## 2020-03-18 DIAGNOSIS — Z23 Encounter for immunization: Secondary | ICD-10-CM

## 2020-11-16 ENCOUNTER — Other Ambulatory Visit: Payer: Self-pay

## 2020-11-16 ENCOUNTER — Encounter: Payer: Self-pay | Admitting: Emergency Medicine

## 2020-11-16 ENCOUNTER — Emergency Department
Admission: EM | Admit: 2020-11-16 | Discharge: 2020-11-16 | Disposition: A | Discharge status: Home / Self Care | Payer: Self-pay | Attending: Emergency Medicine | Admitting: Emergency Medicine

## 2020-11-16 DIAGNOSIS — L03113 Cellulitis of right upper limb: Secondary | ICD-10-CM

## 2020-11-16 DIAGNOSIS — M79601 Pain in right arm: Secondary | ICD-10-CM

## 2020-11-16 DIAGNOSIS — Z87891 Personal history of nicotine dependence: Secondary | ICD-10-CM | POA: Insufficient documentation

## 2020-11-16 LAB — COMPREHENSIVE METABOLIC PANEL
ALT: 12 U/L (ref 0–44)
AST: 16 U/L (ref 15–41)
Albumin: 4.3 g/dL (ref 3.5–5.0)
Alkaline Phosphatase: 43 U/L (ref 38–126)
Anion gap: 7 (ref 5–15)
BUN: 9 mg/dL (ref 6–20)
CO2: 25 mmol/L (ref 22–32)
Calcium: 9.1 mg/dL (ref 8.9–10.3)
Chloride: 104 mmol/L (ref 98–111)
Creatinine, Ser: 0.71 mg/dL (ref 0.61–1.24)
GFR, Estimated: >60 mL/min (ref >60)
Glucose, Bld: 103 mg/dL — ABNORMAL HIGH (ref 70–99)
Potassium: 3.9 mmol/L (ref 3.5–5.1)
Sodium: 136 mmol/L (ref 135–145)
Total Bilirubin: 0.7 mg/dL (ref 0.3–1.2)
Total Protein: 7 g/dL (ref 6.5–8.1)

## 2020-11-16 LAB — CBC
HCT: 46.6 % (ref 39.0–52.0)
Hemoglobin: 16.2 g/dL (ref 13.0–17.0)
MCH: 32 pg (ref 26.0–34.0)
MCHC: 34.8 g/dL (ref 30.0–36.0)
MCV: 92.1 fL (ref 80.0–100.0)
Platelets: 262 10*3/uL (ref 150–400)
RBC: 5.06 MIL/uL (ref 4.22–5.81)
RDW: 12.5 % (ref 11.5–15.5)
WBC: 8.9 10*3/uL (ref 4.0–10.5)
nRBC: 0 % (ref 0.0–0.2)

## 2020-11-16 MED ORDER — SULFAMETHOXAZOLE-TRIMETHOPRIM 800-160 MG PO TABS: 1.0000 | ORAL_TABLET | Freq: Two times a day (BID) | ORAL | 0 refills | Status: AC

## 2020-11-16 MED ORDER — SULFAMETHOXAZOLE-TRIMETHOPRIM 800-160 MG PO TABS
1.0000 | ORAL_TABLET | Freq: Once | ORAL | Status: AC
  Administered 2020-11-16: 1 via ORAL
  Filled 2020-11-16: qty 1

## 2020-11-16 MED ORDER — NAPROXEN 500 MG PO TABS
500.0000 mg | ORAL_TABLET | Freq: Once | ORAL | Status: AC
  Administered 2020-11-16: 500 mg via ORAL
  Filled 2020-11-16: qty 1

## 2020-11-16 MED ORDER — ACETAMINOPHEN 500 MG PO TABS
1000.0000 mg | ORAL_TABLET | Freq: Once | ORAL | Status: AC
  Administered 2020-11-16: 1000 mg via ORAL
  Filled 2020-11-16: qty 2

## 2020-11-16 NOTE — ED Triage Notes (Signed)
Insect bite to right elbow are on Friday, later that night/ early morning Saturday, right fore arm swelling and pain noted.  Right forearm swelling noted.  + radial pulse.  Brisk capillary refill to hand.  NAD

## 2020-11-16 NOTE — ED Notes (Signed)
Neuro vascular assessment of the right arm:  Right arm noted to be reddened and swollen most likely from a insect bite Friday night.  +radial pulse, fingers are warm to the touch and freely moveable, + sensation

## 2020-11-16 NOTE — ED Provider Notes (Signed)
[facility] Emergency Department Provider Note ____________________________________________   Event Date/Time   First MD Initiated Contact with Patient 11/16/20 2255     (approximate)  I have reviewed the triage vital signs and the nursing notes.  HISTORY  Chief Complaint Arm Swelling   HPI Ernest Ramirez is a 25 y.o. Ernest Ramirez presents to the ED for evaluation of arm swelling and pain.   Chart review indicates no relevant hx.   Patient presents to the ED, accompanied by his mother, for evaluation of right arm swelling and pain.  Patient reports being outside at work when he had a bug bite to the medial aspect of his right distal bicep, and has had increasing swelling, erythema and pain to his right forearm since that time.  This occurred 3-4 days ago.  He reports swelling and pain diffusely over his right forearm and elbow, improved with elevation, but persistent for the past few days.  Denies any fevers or systemic symptoms.    History reviewed. No pertinent past medical history.  There are no problems to display for this patient.   History reviewed. No pertinent surgical history.  Prior to Admission medications   Medication Sig Start Date End Date Taking? Authorizing Provider  sulfamethoxazole-trimethoprim (BACTRIM DS) 800-160 MG tablet Take 1 tablet by mouth 2 (two) times daily for 7 days. 11/16/20 11/23/20 Yes Delton Prairie, MD  magic mouthwash w/lidocaine SOLN Take 10 mLs by mouth 4 (four) times daily as needed for mouth pain. 12/03/19   Menshew, Charlesetta Ivory, PA-C    Allergies Penicillins  No family history on file.  Social History Social History   Tobacco Use   Smoking status: Former    Pack years: 0.00   Smokeless tobacco: Never    Review of Systems  Constitutional: No fever/chills Eyes: No visual changes. ENT: No sore throat. Cardiovascular: Denies chest pain. Respiratory: Denies shortness of breath. Gastrointestinal: No  abdominal pain.  No nausea, no vomiting.  No diarrhea.  No constipation. Genitourinary: Negative for dysuria. Musculoskeletal: Negative for back pain.  Right arm swelling and pain after a bug bite Skin: Negative for rash. Neurological: Negative for headaches, focal weakness or numbness.  ____________________________________________   PHYSICAL EXAM:  VITAL SIGNS: Vitals:   11/16/20 1805 11/16/20 2241  BP: (!) 141/87 128/79  Pulse: 76 (!) 56  Resp: 16 16  Temp: 98.5 F (36.9 C)   SpO2: 99% 97%     Constitutional: Alert and oriented. Well appearing and in no acute distress. Eyes: Conjunctivae are normal. PERRL. EOMI. Head: Atraumatic. Nose: No congestion/rhinnorhea. Mouth/Throat: Mucous membranes are moist.  Oropharynx non-erythematous. Neck: No stridor. No cervical spine tenderness to palpation. Cardiovascular: Normal rate, regular rhythm. Grossly normal heart sounds.  Good peripheral circulation. Respiratory: Normal respiratory effort.  No retractions. Lungs CTAB. Gastrointestinal: Soft , nondistended, nontender to palpation. No CVA tenderness. Musculoskeletal: No lower extremity tenderness nor edema.  No joint effusions.  Neurologic:  Normal speech and language. No gross focal neurologic deficits are appreciated. No gait instability noted. Skin:  Skin is warm, dry .  Small erythematous defect consistent with a bug bite located to the medial right distal upper arm, just proximal to his elbow.  Surrounding this is erythematous and shiny flat discoloration to the forearm.  Not circumferential.  Distally neurovascularly intact.  No fluctuance or evidence of abscess.  Some induration around the bug bite.  Does not extend to the deltoid or shoulder proximally.  Soft tissue swelling of the  right arm compared to the left.  No impaired range of motion to the right elbow, wrist or shoulder on active or passive ROM.  No laceration or evidence of additional injury.  No bruising or further  discoloration beyond erythema.  Psychiatric: Mood and affect are normal. Speech and behavior are normal.  ____________________________________________   LABS (all labs ordered are listed, but only abnormal results are displayed)  Labs Reviewed  COMPREHENSIVE METABOLIC PANEL - Abnormal; Notable for the following components:      Result Value   Glucose, Bld 103 (*)    All other components within normal limits  CBC   ____________________________________________  12 Lead EKG   ____________________________________________  RADIOLOGY  ED MD interpretation:    Official radiology report(s): No results found.  ____________________________________________   PROCEDURES and INTERVENTIONS  Procedure(s) performed (including Critical Care):  Procedures  Medications  sulfamethoxazole-trimethoprim (BACTRIM DS) 800-160 MG per tablet 1 tablet (has no administration in time range)  acetaminophen (TYLENOL) tablet 1,000 mg (has no administration in time range)  naproxen (NAPROSYN) tablet 500 mg (has no administration in time range)    ____________________________________________   MDM / ED COURSE   Otherwise healthy 25 year old male presents to the ED with right forearm erythematous rash, pain and swelling after a bug bite, with evidence of cellulitis amenable to outpatient management.  No evidence of compartment syndrome or impaired neurologic or vascular function.  No evidence of abscess to necessitate I&D.  Considering his coexisting bug bite and low risk factors, less likely to be DVT.  We will empirically start antibiotics to treat cellulitis and discharged with return precautions and symptomatic measures.      ____________________________________________   FINAL CLINICAL IMPRESSION(S) / ED DIAGNOSES  Final diagnoses:  Cellulitis of right arm  Right arm pain     ED Discharge Orders          Ordered    sulfamethoxazole-trimethoprim (BACTRIM DS) 800-160 MG tablet  2  times daily        11/16/20 2336             Daysi Boggan   Note:  This document was prepared using Conservation officer, historic buildings and may include unintentional dictation errors.    Delton Prairie, MD 11/16/20 [phone]

## 2020-11-16 NOTE — Discharge Instructions (Addendum)
Like we talked about, I suspect a superficial skin infection after your bug bite, called cellulitis.  You are being discharged with Bactrim antibiotic to take twice daily for the next 7 days.  Please finish all 14 pills, even if your arm started to look better.  Use Tylenol for pain and fevers.  Up to 1000 mg per dose, up to 4 times per day.  Do not take more than 4000 mg of Tylenol/acetaminophen within 24 hours..  Use naproxen/Aleve for anti-inflammatory pain relief. Use up to 500mg  every 12 hours. Do not take more frequently than this. Do not use other NSAIDs (ibuprofen, Advil) while taking this medication. It is safe to take Tylenol with this.   If you develop any worsening symptoms despite these measures, uncontrolled pain, inability to use/feel your right hand or arm, please return to the ED.
